# Patient Record
Sex: Male | Born: 1980 | Hispanic: No | Marital: Married | Smoking: Never smoker
Health system: Southern US, Community
[De-identification: ages and names within clinical notes are randomized; demographics above are authoritative.]

## PROBLEM LIST (undated history)

## (undated) DIAGNOSIS — E119 Type 2 diabetes mellitus without complications: Secondary | ICD-10-CM

## (undated) DIAGNOSIS — E785 Hyperlipidemia, unspecified: Secondary | ICD-10-CM

## (undated) DIAGNOSIS — I10 Essential (primary) hypertension: Secondary | ICD-10-CM

## (undated) DIAGNOSIS — G4730 Sleep apnea, unspecified: Secondary | ICD-10-CM

## (undated) DIAGNOSIS — E669 Obesity, unspecified: Secondary | ICD-10-CM

## (undated) HISTORY — DX: Obesity, unspecified: E66.9

## (undated) HISTORY — DX: Sleep apnea, unspecified: G47.30

## (undated) HISTORY — DX: Hyperlipidemia, unspecified: E78.5

## (undated) HISTORY — DX: Type 2 diabetes mellitus without complications: E11.9

## (undated) HISTORY — DX: Essential (primary) hypertension: I10

---

## 2003-10-01 ENCOUNTER — Encounter: Admission: RE | Admit: 2003-10-01 | Discharge: 2003-10-01 | Payer: Self-pay | Admitting: Sports Medicine

## 2003-11-12 ENCOUNTER — Encounter: Admission: RE | Admit: 2003-11-12 | Discharge: 2003-11-12 | Payer: Self-pay | Admitting: Family Medicine

## 2003-11-13 ENCOUNTER — Encounter: Admission: RE | Admit: 2003-11-13 | Discharge: 2004-02-11 | Payer: Self-pay | Admitting: Family Medicine

## 2003-12-10 ENCOUNTER — Encounter: Admission: RE | Admit: 2003-12-10 | Discharge: 2003-12-10 | Payer: Self-pay | Admitting: Family Medicine

## 2003-12-13 ENCOUNTER — Encounter: Admission: RE | Admit: 2003-12-13 | Discharge: 2003-12-13 | Payer: Self-pay | Admitting: Family Medicine

## 2003-12-28 ENCOUNTER — Encounter: Admission: RE | Admit: 2003-12-28 | Discharge: 2003-12-28 | Payer: Self-pay | Admitting: Sports Medicine

## 2004-06-27 ENCOUNTER — Ambulatory Visit (HOSPITAL_BASED_OUTPATIENT_CLINIC_OR_DEPARTMENT_OTHER): Admission: RE | Admit: 2004-06-27 | Discharge: 2004-06-27 | Payer: Self-pay | Admitting: Otolaryngology

## 2004-08-26 ENCOUNTER — Ambulatory Visit (HOSPITAL_BASED_OUTPATIENT_CLINIC_OR_DEPARTMENT_OTHER): Admission: RE | Admit: 2004-08-26 | Discharge: 2004-08-26 | Payer: Self-pay | Admitting: Otolaryngology

## 2004-08-26 ENCOUNTER — Ambulatory Visit: Payer: Self-pay | Admitting: Internal Medicine

## 2004-10-13 ENCOUNTER — Ambulatory Visit: Payer: Self-pay | Admitting: Internal Medicine

## 2004-10-20 ENCOUNTER — Ambulatory Visit: Payer: Self-pay | Admitting: Family Medicine

## 2004-11-17 ENCOUNTER — Ambulatory Visit: Payer: Self-pay | Admitting: Family Medicine

## 2004-11-18 ENCOUNTER — Ambulatory Visit: Payer: Self-pay | Admitting: *Deleted

## 2004-12-08 ENCOUNTER — Ambulatory Visit: Payer: Self-pay | Admitting: Internal Medicine

## 2004-12-08 ENCOUNTER — Ambulatory Visit: Payer: Self-pay | Admitting: Family Medicine

## 2004-12-11 ENCOUNTER — Ambulatory Visit: Payer: Self-pay | Admitting: Internal Medicine

## 2004-12-31 ENCOUNTER — Ambulatory Visit: Payer: Self-pay | Admitting: Internal Medicine

## 2005-01-09 ENCOUNTER — Ambulatory Visit: Payer: Self-pay | Admitting: Family Medicine

## 2005-01-21 ENCOUNTER — Ambulatory Visit: Payer: Self-pay | Admitting: Internal Medicine

## 2006-09-16 DIAGNOSIS — G4730 Sleep apnea, unspecified: Secondary | ICD-10-CM | POA: Insufficient documentation

## 2006-09-16 DIAGNOSIS — J309 Allergic rhinitis, unspecified: Secondary | ICD-10-CM | POA: Insufficient documentation

## 2006-09-16 DIAGNOSIS — I10 Essential (primary) hypertension: Secondary | ICD-10-CM | POA: Insufficient documentation

## 2006-09-16 DIAGNOSIS — E669 Obesity, unspecified: Secondary | ICD-10-CM

## 2014-01-25 ENCOUNTER — Ambulatory Visit (INDEPENDENT_AMBULATORY_CARE_PROVIDER_SITE_OTHER): Payer: No Typology Code available for payment source | Admitting: Internal Medicine

## 2014-01-25 ENCOUNTER — Encounter: Payer: Self-pay | Admitting: Internal Medicine

## 2014-01-25 VITALS — BP 115/61 | HR 86 | Temp 98.3°F | Resp 20 | Ht 67.0 in | Wt 284.0 lb

## 2014-01-25 DIAGNOSIS — E1165 Type 2 diabetes mellitus with hyperglycemia: Principal | ICD-10-CM

## 2014-01-25 DIAGNOSIS — E785 Hyperlipidemia, unspecified: Secondary | ICD-10-CM

## 2014-01-25 DIAGNOSIS — E119 Type 2 diabetes mellitus without complications: Secondary | ICD-10-CM | POA: Insufficient documentation

## 2014-01-25 DIAGNOSIS — IMO0001 Reserved for inherently not codable concepts without codable children: Secondary | ICD-10-CM

## 2014-01-25 DIAGNOSIS — G4730 Sleep apnea, unspecified: Secondary | ICD-10-CM

## 2014-01-25 DIAGNOSIS — E669 Obesity, unspecified: Secondary | ICD-10-CM

## 2014-01-25 DIAGNOSIS — J309 Allergic rhinitis, unspecified: Secondary | ICD-10-CM

## 2014-01-25 DIAGNOSIS — I10 Essential (primary) hypertension: Secondary | ICD-10-CM

## 2014-01-25 MED ORDER — LISINOPRIL-HYDROCHLOROTHIAZIDE 20-25 MG PO TABS: 1.0000 | ORAL_TABLET | Freq: Every day | ORAL | Status: DC

## 2014-01-25 MED ORDER — LOVASTATIN 20 MG PO TABS: 20.0000 mg | ORAL_TABLET | Freq: Every day | ORAL | Status: DC

## 2014-01-25 MED ORDER — METFORMIN HCL 1000 MG PO TABS: 1000.0000 mg | ORAL_TABLET | Freq: Two times a day (BID) | ORAL | Status: DC

## 2014-01-25 MED ORDER — GLIMEPIRIDE 2 MG PO TABS: 2.0000 mg | ORAL_TABLET | Freq: Every day | ORAL | Status: DC

## 2014-01-25 NOTE — Progress Notes (Signed)
Patient ID: Austin Mejia, male   DOB: Nov 07, 1980, 33 y.o.   MRN: [id number]   Austin Mejia, is a 33 y.o. male  [id number]  [id number]  DOB - Nov 07, 1980  CC:  Chief Complaint  Patient presents with  . Establish Care    NP/ Pt is here to recieve treatment for his HTN, Diabeties, as well as High Lipids        HPI: Austin Mejia is a 33 y.o. male here today to establish medical care. He has multiple chronic medical problems including obesity, DM II, Hyperlipidemia and treated HTN. 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  Patient has No headache, No chest pain, No abdominal pain - No Nausea, No new weakness tingling or numbness, No Cough - SOB.  No Known Allergies No past medical history on file. No current outpatient prescriptions on file prior to visit.   No current facility-administered medications on file prior to visit.   No family history on file. History   Social History  . Marital Status: Single    Spouse Name: N/A    Number of Children: N/A  . Years of Education: N/A   Occupational History  . Not on file.   Social History Main Topics  . Smoking status: Never Smoker   . Smokeless tobacco: Not on file  . Alcohol Use: No  . Drug Use: Not on file  . Sexual Activity: Not on file   Other Topics Concern  . Not on file   Social History Narrative  . No narrative on file    Review of Systems: Constitutional: Negative for fever, chills, diaphoresis, activity change, appetite change and fatigue. HENT: Negative for ear pain, nosebleeds, congestion, facial swelling, rhinorrhea, neck pain, neck stiffness and ear discharge.  Eyes: Negative for pain, discharge, redness, itching and visual disturbance. Respiratory: Negative for cough, choking, chest tightness, shortness of breath, wheezing and stridor.  Cardiovascular: Negative for chest pain,  palpitations and leg swelling. Gastrointestinal: Negative for abdominal distention. Genitourinary: Negative for dysuria, urgency, frequency, hematuria, flank pain, decreased urine volume, difficulty urinating and dyspareunia.  Musculoskeletal: Negative for back pain, joint swelling, arthralgia and gait problem. Neurological: Negative for dizziness, tremors, seizures, syncope, facial asymmetry, speech difficulty, weakness, light-headedness, numbness and headaches.  Hematological: Negative for adenopathy. Does not bruise/bleed easily. Psychiatric/Behavioral: Negative for hallucinations, behavioral problems, confusion, dysphoric mood, decreased concentration and agitation.    Objective:    Filed Vitals:   01/25/14 1415  BP: 115/61  Pulse: 86  Temp: 98.3 F (36.8 C)  Resp: 20    Physical Exam: Constitutional: Patient appears morbidly obese. No distress. HENT: Normocephalic, atraumatic, External right and left ear normal. Oropharynx is clear and moist.  Eyes: Conjunctivae and EOM are normal. PERRLA, no scleral icterus. Neck: Normal ROM. Neck supple. No JVD. No tracheal deviation. No thyromegaly. CVS: RRR, S1/S2 +, no murmurs, no gallops, no carotid bruit.  Pulmonary: Effort and breath sounds normal, no stridor, rhonchi, wheezes, rales.  Abdominal: Soft. BS +, no distension, tenderness, rebound or guarding.  Musculoskeletal: Normal range of motion. No edema and no tenderness.  Lymphadenopathy: No lymphadenopathy noted, cervical, inguinal or axillary Neuro: Alert. Normal reflexes, muscle tone coordination. No cranial nerve deficit. Skin: Skin is warm and dry. No rash noted. Not diaphoretic. No erythema. No pallor. Psychiatric: Normal mood and affect. Behavior, judgment, thought content normal.  No results found for this basename: WBC, HGB, HCT,  MCV, PLT   No results found for this basename: CREATININE, BUN, NA, K, CL, CO2    No results found for this basename: HGBA1C   Lipid Panel   No results found for this basename: chol, trig, hdl, cholhdl, vldl, ldlcalc       Assessment and plan:   1. Type II or unspecified type diabetes mellitus without mention of complication, uncontrolled - Pt reports BS to be in the 200's in the evening. He does not check fasting BS. Will continue Metformin and Glimiperide. Advised patient to check fasting blood sugars as well as post prandial BS. Will check the following labs. - Hemoglobin A1C; Future - Comprehensive metabolic panel; Future - Urinalysis; Future  2. APNEA, SLEEP Pt states that he has been using his CPAP but does not think tha this mask is forming a good seal. He has called [facility] for mask replacement. - CBC with Differential; Future  3. HYPERTENSION, BENIGN SYSTEMIC BP well controlled. Continue current medications - Comprehensive metabolic panel; Future - Urinalysis; Future  4. OBESITY, NOS Pt morbidly obese and has not ben able to lose weight for almost 10 years. Will schedule an appointment to discuss obesity plan. Also make referral to nutrition services. - TSH; Future   6. Other and unspecified hyperlipidemia Continue Lovasatatin - Lipid panel; Future   Return for DM II, Hyperlipidemia, Obesity, OSA.  The patient was given clear instructions to go to ER or return to medical center if symptoms don't improve, worsen or new problems develop. The patient verbalized understanding. The patient was told to call to get lab results if they haven't heard anything in the next week.     This note has been created with Education officer, environmental. Any transcriptional errors are unintentional.    Fremont Skalicky A., MD [facility] [place], [place] [phone]   01/25/2014, 3:08 PM

## 2014-01-26 ENCOUNTER — Other Ambulatory Visit: Payer: No Typology Code available for payment source

## 2014-01-26 DIAGNOSIS — I10 Essential (primary) hypertension: Secondary | ICD-10-CM

## 2014-01-26 DIAGNOSIS — E669 Obesity, unspecified: Secondary | ICD-10-CM

## 2014-01-26 DIAGNOSIS — G4730 Sleep apnea, unspecified: Secondary | ICD-10-CM

## 2014-01-26 DIAGNOSIS — E785 Hyperlipidemia, unspecified: Secondary | ICD-10-CM

## 2014-01-26 DIAGNOSIS — IMO0001 Reserved for inherently not codable concepts without codable children: Secondary | ICD-10-CM

## 2014-01-26 DIAGNOSIS — E1165 Type 2 diabetes mellitus with hyperglycemia: Principal | ICD-10-CM

## 2014-01-26 LAB — LIPID PANEL
CHOLESTEROL: 139 mg/dL (ref 0–200)
HDL: 39 mg/dL — ABNORMAL LOW (ref >39)
LDL Cholesterol: 66 mg/dL (ref 0–99)
TRIGLYCERIDES: 172 mg/dL — AB (ref <150)
Total CHOL/HDL Ratio: 3.6 Ratio
VLDL: 34 mg/dL (ref 0–40)

## 2014-01-26 LAB — COMPREHENSIVE METABOLIC PANEL
ALT: 24 U/L (ref 0–53)
AST: 16 U/L (ref 0–37)
Albumin: 4.4 g/dL (ref 3.5–5.2)
Alkaline Phosphatase: 63 U/L (ref 39–117)
BUN: 16 mg/dL (ref 6–23)
CALCIUM: 9.3 mg/dL (ref 8.4–10.5)
CHLORIDE: 102 meq/L (ref 96–112)
CO2: 24 meq/L (ref 19–32)
CREATININE: 0.78 mg/dL (ref 0.50–1.35)
GLUCOSE: 161 mg/dL — AB (ref 70–99)
Potassium: 4.9 mEq/L (ref 3.5–5.3)
Sodium: 135 mEq/L (ref 135–145)
Total Bilirubin: 0.6 mg/dL (ref 0.2–1.2)
Total Protein: 7.2 g/dL (ref 6.0–8.3)

## 2014-01-26 LAB — CBC WITH DIFFERENTIAL/PLATELET
BASOS ABS: 0.1 10*3/uL (ref 0.0–0.1)
Basophils Relative: 1 % (ref 0–1)
Eosinophils Absolute: 0.2 10*3/uL (ref 0.0–0.7)
Eosinophils Relative: 2 % (ref 0–5)
HCT: 41.6 % (ref 39.0–52.0)
HEMOGLOBIN: 14.2 g/dL (ref 13.0–17.0)
LYMPHS PCT: 34 % (ref 12–46)
Lymphs Abs: 2.8 10*3/uL (ref 0.7–4.0)
MCH: 28.6 pg (ref 26.0–34.0)
MCHC: 34.1 g/dL (ref 30.0–36.0)
MCV: 83.9 fL (ref 78.0–100.0)
MONO ABS: 0.4 10*3/uL (ref 0.1–1.0)
MONOS PCT: 5 % (ref 3–12)
Neutro Abs: 4.8 10*3/uL (ref 1.7–7.7)
Neutrophils Relative %: 58 % (ref 43–77)
Platelets: 309 10*3/uL (ref 150–400)
RBC: 4.96 MIL/uL (ref 4.22–5.81)
RDW: 14.1 % (ref 11.5–15.5)
WBC: 8.2 10*3/uL (ref 4.0–10.5)

## 2014-01-27 LAB — URINALYSIS
BILIRUBIN URINE: NEGATIVE
Glucose, UA: NEGATIVE mg/dL
Hgb urine dipstick: NEGATIVE
Ketones, ur: NEGATIVE mg/dL
LEUKOCYTES UA: NEGATIVE
Nitrite: NEGATIVE
Protein, ur: NEGATIVE mg/dL
SPECIFIC GRAVITY, URINE: 1.025 (ref 1.005–1.030)
UROBILINOGEN UA: 0.2 mg/dL (ref 0.0–1.0)
pH: 5.5 (ref 5.0–8.0)

## 2014-01-27 LAB — TSH: TSH: 1.406 u[IU]/mL (ref 0.350–4.500)

## 2014-01-27 LAB — HEMOGLOBIN A1C
Hgb A1c MFr Bld: 8.8 % — ABNORMAL HIGH (ref <5.7)
Mean Plasma Glucose: 206 mg/dL — ABNORMAL HIGH (ref <117)

## 2014-02-21 ENCOUNTER — Ambulatory Visit: Payer: No Typology Code available for payment source

## 2014-02-27 ENCOUNTER — Encounter: Payer: Self-pay | Admitting: Family Medicine

## 2014-02-27 ENCOUNTER — Ambulatory Visit (INDEPENDENT_AMBULATORY_CARE_PROVIDER_SITE_OTHER): Payer: No Typology Code available for payment source | Admitting: Family Medicine

## 2014-02-27 VITALS — BP 134/67 | HR 76 | Temp 98.1°F | Resp 18 | Wt 290.0 lb

## 2014-02-27 DIAGNOSIS — E669 Obesity, unspecified: Secondary | ICD-10-CM

## 2014-02-27 DIAGNOSIS — G4730 Sleep apnea, unspecified: Secondary | ICD-10-CM

## 2014-02-27 DIAGNOSIS — I10 Essential (primary) hypertension: Secondary | ICD-10-CM

## 2014-02-27 DIAGNOSIS — Z23 Encounter for immunization: Secondary | ICD-10-CM

## 2014-02-27 DIAGNOSIS — E1165 Type 2 diabetes mellitus with hyperglycemia: Secondary | ICD-10-CM

## 2014-02-27 DIAGNOSIS — IMO0001 Reserved for inherently not codable concepts without codable children: Secondary | ICD-10-CM

## 2014-02-27 NOTE — Progress Notes (Signed)
Subjective:    Patient ID: Austin Mejia, male    DOB: [birth year], 33 y.o.   MRN: [id number]  HPI  Patient presents for 1 month follow up of chronic diseases. He states that he feels well and is taking medications consistently. He reports that he has not changed his diet or started an exercise regimen.   Patient complaining of diabetes mellitus, type II. He states that he has been taking medications consistently. He reports that he has not modified diet. He denies headache, fatigue,weakness, polyuria, polydipsia, and polyphagia.   Patient here for follow-up of hypertension. He is not exercising and is not adherent to low salt diet.  Blood pressure is well controlled at home. Cardiac symptoms  Patient denies chest pain, chest pressure/discomfort, dyspnea, fatigue, irregular heart beat, orthopnea, palpitations and syncope.  Cardiovascular risk factors include  diabetes mellitus, dyslipidemia, family history of premature cardiovascular disease, obesity (BMI >= 30 kg/m2) and sedentary lifestyle.      Past Medical History  Diagnosis Date  . Diabetes mellitus without complication   . Hyperlipidemia   . Hypertension   . Sleep apnea   . Obesity y-10     Review of Systems  Constitutional: Positive for unexpected weight change. Negative for fatigue.  HENT: Negative.   Eyes: Negative.   Respiratory: Positive for shortness of breath. Negative for wheezing.   Cardiovascular: Negative.   Gastrointestinal: Negative.   Endocrine: Negative.  Negative for cold intolerance, heat intolerance, polydipsia, polyphagia and polyuria.  Genitourinary: Negative.   Musculoskeletal: Negative.  Negative for back pain and myalgias.  Skin: Negative.   Allergic/Immunologic: Negative.   Neurological: Negative.  Negative for dizziness and light-headedness.  Hematological: Negative.   Psychiatric/Behavioral: Negative.       Objective:   Physical Exam  Constitutional: He is oriented to person,  place, and time. He appears well-developed and well-nourished. He is active. He does not appear ill.  HENT:  Head: Normocephalic.  Right Ear: External ear normal.  Eyes: Conjunctivae and EOM are normal. Pupils are equal, round, and reactive to light.  Neck: Normal range of motion. Neck supple.  Cardiovascular: Normal rate, regular rhythm and normal heart sounds.   Musculoskeletal: Normal range of motion.  Neurological: He is alert and oriented to person, place, and time.  Skin: Skin is warm and dry.     Psychiatric: He has a normal mood and affect. His behavior is normal. Judgment and thought content normal.      BP 134/67  Pulse 76  Temp(Src) 98.1 F (36.7 C) (Oral)  Resp 18  Wt 290 lb (131.543 kg)  SpO2 99%   .   Assessment & Plan:   1. Diabetes Type II: Patient reports that his blood sugars have improved on current medication regimen. He is not checking blood sugars in the am. Recommend that patient checks fasting blood sugars. Discussed symptoms of hypoglycemia, patient expressed understanding. Reviewed current laboratory results, will re-check hemoglobin A1C in 2 months. Will continue Metformin and Glimiperide. Advised patient to check fasting blood sugars as well as post prandial BS. Will check the following labs. Diabetic foot examination normal.   2. OSA- Patient states that he is using CPAP consistently. Sleep has improved since replacing mask.   3. Hypertension-BP stable on current medication regimen, will continue current medication regimen  4.  Obesity- Patient has gained weight since last visit. Given information on 2000 calorie diet divided over 6 small meals. Discussed meal plan information at length. Drink  6-8 glasses of  water per day. Exercise 30 minutes 5 days per week.  Obesity referral sent per Dr. Ashley RoyaltyMatthews previous note.  5. Hyperlipidemia-Continue current medication regimen. Will check fasting lipid panel (future order). Start Aspirin 81 mg daily.   RTC:  3 months Labs: Hgba1C, lipid panel (future orders)    Immunizations: Tdap received without complication.        Massie MaroonHollis,Ayoub Arey M, FNP

## 2014-02-27 NOTE — Patient Instructions (Signed)
2000 calorie diet divided over 6 meals. Low fat diet  8-10 glasses of water Exercise 30 minutes for 5 days per week Baby aspirin 81 mg

## 2014-05-30 ENCOUNTER — Ambulatory Visit: Payer: No Typology Code available for payment source | Admitting: Family Medicine

## 2014-06-06 ENCOUNTER — Ambulatory Visit: Payer: No Typology Code available for payment source | Admitting: Family Medicine

## 2014-06-22 ENCOUNTER — Ambulatory Visit: Payer: No Typology Code available for payment source | Attending: Family Medicine

## 2014-07-05 ENCOUNTER — Ambulatory Visit: Payer: No Typology Code available for payment source | Admitting: Family Medicine

## 2017-03-02 ENCOUNTER — Encounter (INDEPENDENT_AMBULATORY_CARE_PROVIDER_SITE_OTHER): Payer: Self-pay | Admitting: Physician Assistant

## 2017-03-02 ENCOUNTER — Ambulatory Visit (INDEPENDENT_AMBULATORY_CARE_PROVIDER_SITE_OTHER): Payer: Self-pay | Admitting: Physician Assistant

## 2017-03-02 VITALS — BP 135/88 | HR 76 | Temp 98.1°F | Wt 257.4 lb

## 2017-03-02 DIAGNOSIS — R739 Hyperglycemia, unspecified: Secondary | ICD-10-CM

## 2017-03-02 DIAGNOSIS — Z23 Encounter for immunization: Secondary | ICD-10-CM

## 2017-03-02 DIAGNOSIS — B3742 Candidal balanitis: Secondary | ICD-10-CM

## 2017-03-02 DIAGNOSIS — E118 Type 2 diabetes mellitus with unspecified complications: Secondary | ICD-10-CM

## 2017-03-02 LAB — POCT CBG (FASTING - GLUCOSE)-MANUAL ENTRY: Glucose Fasting, POC: 266 mg/dL — AB (ref 70–99)

## 2017-03-02 LAB — POCT GLYCOSYLATED HEMOGLOBIN (HGB A1C): Hemoglobin A1C: 11

## 2017-03-02 MED ORDER — METFORMIN HCL 1000 MG PO TABS: 1000.0000 mg | ORAL_TABLET | Freq: Two times a day (BID) | ORAL | 3 refills | Status: DC

## 2017-03-02 MED ORDER — GLIPIZIDE 10 MG PO TABS: 10.0000 mg | ORAL_TABLET | Freq: Two times a day (BID) | ORAL | 3 refills | Status: DC

## 2017-03-02 MED ORDER — BLOOD GLUCOSE MONITOR KIT: PACK | 0 refills | Status: AC

## 2017-03-02 MED ORDER — FLUCONAZOLE 150 MG PO TABS: 150.0000 mg | ORAL_TABLET | ORAL | 0 refills | Status: DC

## 2017-03-02 MED ORDER — LISINOPRIL 40 MG PO TABS: 40.0000 mg | ORAL_TABLET | Freq: Every day | ORAL | 3 refills | Status: DC

## 2017-03-02 MED FILL — TRUE METRIX TEST STRIP: 30 days supply | Qty: 100 | Fill #0

## 2017-03-02 MED FILL — TRUEplus LANCETS 28G MISC: 30 days supply | Qty: 100 | Fill #0

## 2017-03-02 MED FILL — !TRUE METRIX BLOOD GLUCOSE: 1 days supply | Qty: 1 | Fill #0

## 2017-03-02 NOTE — Patient Instructions (Signed)
La diabetes mellitus y los alimentos (Diabetes Mellitus and Food) Es importante que controle su nivel de azcar en la sangre (glucosa). El nivel de glucosa en sangre depende en gran medida de lo que usted come. Comer alimentos saludables en las cantidades adecuadas a lo largo del da, aproximadamente a la misma hora todos los das, lo ayudar a controlar su nivel de glucosa en sangre. Tambin puede ayudarlo a retrasar o evitar el empeoramiento de la diabetes mellitus. Comer de manera saludable incluso puede ayudarlo a mejorar el nivel de presin arterial y a alcanzar o mantener un peso saludable. Entre las recomendaciones generales para alimentarse y cocinar los alimentos de forma saludable, se incluyen las siguientes:  Respetar las comidas principales y comer colaciones con regularidad. Evitar pasar largos perodos sin comer con el fin de perder peso.  Seguir una dieta que consista principalmente en alimentos de origen vegetal, como frutas, vegetales, frutos secos, legumbres y cereales integrales.  Utilizar mtodos de coccin a baja temperatura, como hornear, en lugar de mtodos de coccin a alta temperatura, como frer en abundante aceite. Trabaje con el nutricionista para aprender a usar la informacin nutricional de las etiquetas de los alimentos. CMO PUEDEN AFECTARME LOS ALIMENTOS? Carbohidratos Los carbohidratos afectan el nivel de glucosa en sangre ms que cualquier otro tipo de alimento. El nutricionista lo ayudar a determinar cuntos carbohidratos puede consumir en cada comida y ensearle a contarlos. El recuento de carbohidratos es importante para mantener la glucosa en sangre en un nivel saludable, en especial si utiliza insulina o toma determinados medicamentos para la diabetes mellitus. Alcohol El alcohol puede provocar disminuciones sbitas de la glucosa en sangre (hipoglucemia), en especial si utiliza insulina o toma determinados medicamentos para la diabetes mellitus. La  hipoglucemia es una afeccin que puede poner en peligro la vida. Los sntomas de la hipoglucemia (somnolencia, mareos y desorientacin) son similares a los sntomas de haber consumido mucho alcohol. Si el mdico lo autoriza a beber alcohol, hgalo con moderacin y siga estas pautas:  Las mujeres no deben beber ms de un trago por da, y los hombres no deben beber ms de dos tragos por da. Un trago es igual a:  12 onzas (355 ml) de cerveza  5 onzas de vino (150 ml) de vino  1,5onzas (45ml) de bebidas espirituosas  No beba con el estmago vaco.  Mantngase hidratado. Beba agua, gaseosas dietticas o t helado sin azcar.  Las gaseosas comunes, los jugos y otros refrescos podran contener muchos carbohidratos y se deben contar. QU ALIMENTOS NO SE RECOMIENDAN? Cuando haga las elecciones de alimentos, es importante que recuerde que todos los alimentos son distintos. Algunos tienen menos nutrientes que otros por porcin, aunque podran tener la misma cantidad de caloras o carbohidratos. Es difcil darle al cuerpo lo que necesita cuando consume alimentos con menos nutrientes. Estos son algunos ejemplos de alimentos que debera evitar ya que contienen muchas caloras y carbohidratos, pero pocos nutrientes:  Grasas trans (la mayora de los alimentos procesados incluyen grasas trans en la etiqueta de Informacin nutricional).  Gaseosas comunes.  Jugos.  Caramelos.  Dulces, como tortas, pasteles, rosquillas y galletas.  Comidas fritas. QU ALIMENTOS PUEDO COMER? Consuma alimentos ricos en nutrientes, que nutrirn el cuerpo y lo mantendrn saludable. Los alimentos que debe comer tambin dependern de varios factores, como:  Las caloras que necesita.  Los medicamentos que toma.  Su peso.  El nivel de glucosa en sangre.  El nivel de presin arterial.  El nivel de colesterol. Debe consumir   una amplia variedad de alimentos, por ejemplo:  Protenas.  Cortes de carne  magros.  Protenas con bajo contenido de grasas saturadas, como pescado, clara de huevo y frijoles. Evite las carnes procesadas.  Frutas y vegetales.  Frutas y vegetales que pueden ayudar a controlar los niveles sanguneos de glucosa, como manzanas, mangos y batatas.  Productos lcteos.  Elija productos lcteos sin grasa o con bajo contenido de grasa, como leche, yogur y queso.  Cereales, panes, pastas y arroz.  Elija cereales integrales, como panes multicereales, avena en grano y arroz integral. Estos alimentos pueden ayudar a controlar la presin arterial.  Grasas.  Alimentos que contengan grasas saludables, como frutos secos, aguacate, aceite de oliva, aceite de canola y pescado. TODOS LOS QUE PADECEN DIABETES MELLITUS TIENEN EL MISMO PLAN DE COMIDAS? Dado que todas las personas que padecen diabetes mellitus son distintas, no hay un solo plan de comidas que funcione para todos. Es muy importante que se rena con un nutricionista que lo ayudar a crear un plan de comidas adecuado para usted. Esta informacin no tiene como fin reemplazar el consejo del mdico. Asegrese de hacerle al mdico cualquier pregunta que tenga. Document Released: 10/13/2007 Document Revised: 07/27/2014 Document Reviewed: 06/02/2013 Elsevier Interactive Patient Education  2017 Elsevier Inc.  

## 2017-03-02 NOTE — Progress Notes (Signed)
Subjective:  Patient ID: Austin Mejia, male    DOB: [birth year]  Age: 36 y.o. MRN: [id number]  CC: Dm  HPI Austin Mejia is a 36 y.o. male with a medical hx of DM2, HTN, HLD, OSA, and obesity presents as a new patient for management of DM2. He has been a diabetic for many years. Currently takes Metformin 1022m BID and Glipizide 5 mg qday as directed for the past month. Was noncompliant before. Does not have a glucometer. Has never used insulin. Symptoms include fatigue and occasional dysuria with penile irritation. Denies abdominal pain, confusion, HA, polydipsia, polyuria, polyphagia, tingling, numbness, or visual blurring. Does not endorse any other symptoms or complaints.     ROS Review of Systems  Constitutional: Negative for chills, fever and malaise/fatigue.  Eyes: Negative for blurred vision.  Respiratory: Negative for shortness of breath.   Cardiovascular: Negative for chest pain and palpitations.  Gastrointestinal: Negative for abdominal pain and nausea.  Genitourinary: Positive for dysuria. Negative for hematuria.       Penile irritation  Musculoskeletal: Negative for joint pain and myalgias.  Skin: Negative for rash.  Neurological: Negative for tingling and headaches.  Psychiatric/Behavioral: Negative for depression. The patient is not nervous/anxious.     Objective:  BP 135/88 (BP Location: Left Arm, Patient Position: Sitting, Cuff Size: Normal)   Pulse 76   Temp 98.1 F (36.7 C) (Oral)   Wt 257 lb 6.4 oz (116.8 kg)   SpO2 96%   BMI 40.31 kg/m   BP/Weight 03/02/2017 85/37/482770/01/8674 Systolic BP [id number] Diastolic BP 88 67 61  Wt. (Lbs) 257.4 290 284  BMI 40.31 45.41 44.47      Physical Exam  Constitutional: He is oriented to person, place, and time.  Well developed, obese, NAD, polite  HENT:  Head: Normocephalic and atraumatic.  Missing several teeth  Eyes: No scleral icterus.  Neck: Normal range of motion. Neck supple. No  thyromegaly present.  Cardiovascular: Normal rate, regular rhythm and normal heart sounds.   Pulmonary/Chest: Effort normal and breath sounds normal.  Abdominal: Soft. Bowel sounds are normal. There is no tenderness.  Genitourinary:  Genitourinary Comments: Penis uncircumcised with mild erythema and mild fissuring in the foreskin.  Musculoskeletal: He exhibits no edema.  Lymphadenopathy:    He has no cervical adenopathy.  Neurological: He is alert and oriented to person, place, and time. No cranial nerve deficit. Coordination normal.  Skin: Skin is warm and dry. No rash noted. No erythema. No pallor.  Psychiatric: He has a normal mood and affect. His behavior is normal. Thought content normal.  Vitals reviewed.    Assessment & Plan:    1. Type 2 diabetes mellitus with complication, without long-term current use of insulin (HCC) - Refill metFORMIN (GLUCOPHAGE) 1000 MG tablet; Take 1 tablet (1,000 mg total) by mouth 2 (two) times daily with a meal.  Dispense: 180 tablet; Refill: 3 - Increase glipiZIDE (GLUCOTROL) 10 MG tablet; Take 1 tablet (10 mg total) by mouth 2 (two) times daily before a meal.  Dispense: 60 tablet; Refill: 3 - Increase lisinopril (PRINIVIL,ZESTRIL) 40 MG tablet; Take 1 tablet (40 mg total) by mouth daily.  Dispense: 90 tablet; Refill: 3 - Begin blood glucose meter kit and supplies KIT; Dispense based on patient and insurance preference. Use up to four times daily as directed. (FOR ICD-9 250.00, 250.01).  Dispense: 1 each; Refill: 0 - Comprehensive metabolic panel - Lipid panel  2. Hyperglycemia - Glucose (CBG), Fasting 266  in clinic today - HgB A1c11.0% in clinic today  3. Need for Tdap vaccination - Tdap vaccine greater than or equal to 7yo IM  4. Need for prophylactic vaccination against Streptococcus pneumoniae (pneumococcus) - Pneumococcal conjugate vaccine 13-valent  5. Candidal balanitis - fluconazole (DIFLUCAN) 150 MG tablet; Take 1 tablet (150 mg  total) by mouth every 7 (seven) days.  Dispense: 2 tablet; Refill: 0   Meds ordered this encounter  Medications  . metFORMIN (GLUCOPHAGE) 1000 MG tablet    Sig: Take 1 tablet (1,000 mg total) by mouth 2 (two) times daily with a meal.    Dispense:  180 tablet    Refill:  3    Order Specific Question:   Supervising Provider    Answer:   Tresa Garter [id number]  . glipiZIDE (GLUCOTROL) 10 MG tablet    Sig: Take 1 tablet (10 mg total) by mouth 2 (two) times daily before a meal.    Dispense:  60 tablet    Refill:  3    Order Specific Question:   Supervising Provider    Answer:   Tresa Garter [id number]  . lisinopril (PRINIVIL,ZESTRIL) 40 MG tablet    Sig: Take 1 tablet (40 mg total) by mouth daily.    Dispense:  90 tablet    Refill:  3    Order Specific Question:   Supervising Provider    Answer:   Tresa Garter [id number]  . fluconazole (DIFLUCAN) 150 MG tablet    Sig: Take 1 tablet (150 mg total) by mouth every 7 (seven) days.    Dispense:  2 tablet    Refill:  0    Order Specific Question:   Supervising Provider    Answer:   Tresa Garter [id number]  . blood glucose meter kit and supplies KIT    Sig: Dispense based on patient and insurance preference. Use up to four times daily as directed. (FOR ICD-9 250.00, 250.01).    Dispense:  1 each    Refill:  0    Order Specific Question:   Number of strips    Answer:   100    Order Specific Question:   Number of lancets    Answer:   100    Order Specific Question:   Supervising Provider    Answer:   Tresa Garter [[id number]]    Follow-up: Return in about 4 weeks (around 03/30/2017) for DM 2 bring glucose log.   Clent Demark PA

## 2017-03-03 LAB — COMPREHENSIVE METABOLIC PANEL
A/G RATIO: 1.5 (ref 1.2–2.2)
ALT: 21 IU/L (ref 0–44)
AST: 17 IU/L (ref 0–40)
Albumin: 4.5 g/dL (ref 3.5–5.5)
Alkaline Phosphatase: 83 IU/L (ref 39–117)
BUN / CREAT RATIO: 21 — AB (ref 9–20)
BUN: 16 mg/dL (ref 6–20)
Bilirubin Total: 0.3 mg/dL (ref 0.0–1.2)
CALCIUM: 9.7 mg/dL (ref 8.7–10.2)
CHLORIDE: 99 mmol/L (ref 96–106)
CO2: 22 mmol/L (ref 20–29)
Creatinine, Ser: 0.77 mg/dL (ref 0.76–1.27)
GFR calc Af Amer: 136 mL/min/{1.73_m2} (ref >59)
GFR calc non Af Amer: 118 mL/min/{1.73_m2} (ref >59)
GLUCOSE: 273 mg/dL — AB (ref 65–99)
Globulin, Total: 3.1 g/dL (ref 1.5–4.5)
Potassium: 4.6 mmol/L (ref 3.5–5.2)
Sodium: 138 mmol/L (ref 134–144)
Total Protein: 7.6 g/dL (ref 6.0–8.5)

## 2017-03-03 LAB — LIPID PANEL
CHOLESTEROL TOTAL: 196 mg/dL (ref 100–199)
Chol/HDL Ratio: 4.5 ratio (ref 0.0–5.0)
HDL: 44 mg/dL (ref >39)
LDL Calculated: 95 mg/dL (ref 0–99)
Triglycerides: 283 mg/dL — ABNORMAL HIGH (ref 0–149)
VLDL Cholesterol Cal: 57 mg/dL — ABNORMAL HIGH (ref 5–40)

## 2017-03-10 ENCOUNTER — Telehealth (INDEPENDENT_AMBULATORY_CARE_PROVIDER_SITE_OTHER): Payer: Self-pay

## 2017-03-10 NOTE — Telephone Encounter (Signed)
-----   Message from Loletta Specter, PA-C sent at 03/03/2017  8:18 AM EDT ----- Liver and kidney results normal. Triglycerides are elevated. I recommend using OTC Max strength Fish Oil pills. She may pick whichever brand she wants. Eat less fried and fatty foods. Eat much less sweets.

## 2017-03-10 NOTE — Telephone Encounter (Signed)
Called patient, no answer. Left message asking patient to call the office back,. If patient calls back please provide with lab results, Liver and kidney results normal. Triglycerides are elevated. I recommend using OTC Max strength Fish Oil pills. She may pick whichever brand she wants. Eat less fried and fatty foods. Eat much less sweets. Maryjean Morn, CMA

## 2017-04-01 ENCOUNTER — Ambulatory Visit (INDEPENDENT_AMBULATORY_CARE_PROVIDER_SITE_OTHER): Payer: No Typology Code available for payment source | Admitting: Physician Assistant

## 2019-04-14 ENCOUNTER — Other Ambulatory Visit: Payer: Self-pay | Admitting: Nurse Practitioner

## 2019-04-14 ENCOUNTER — Other Ambulatory Visit: Payer: Self-pay

## 2019-04-14 ENCOUNTER — Other Ambulatory Visit: Payer: No Typology Code available for payment source

## 2019-04-14 ENCOUNTER — Ambulatory Visit
Admission: RE | Admit: 2019-04-14 | Discharge: 2019-04-14 | Disposition: A | Discharge status: Home / Self Care | Payer: Self-pay | Source: Ambulatory Visit | Attending: Nurse Practitioner | Admitting: Nurse Practitioner

## 2019-04-14 DIAGNOSIS — T1490XA Injury, unspecified, initial encounter: Secondary | ICD-10-CM

## 2019-08-14 ENCOUNTER — Ambulatory Visit: Payer: No Typology Code available for payment source | Attending: Internal Medicine

## 2019-08-14 DIAGNOSIS — Z20822 Contact with and (suspected) exposure to covid-19: Secondary | ICD-10-CM

## 2019-08-14 DIAGNOSIS — U071 COVID-19: Secondary | ICD-10-CM | POA: Insufficient documentation

## 2019-08-15 LAB — NOVEL CORONAVIRUS, NAA: SARS-CoV-2, NAA: DETECTED — AB

## 2021-04-13 IMAGING — CR DG HAND COMPLETE 3+V*R*
3 series · 3 of 3 positions shown · non-contrast
Comparison: None.

CLINICAL DATA: Right palm redness and swelling. Recent foreign
body.

EXAM:
RIGHT HAND - COMPLETE 3+ VIEW

[x hand pa right]
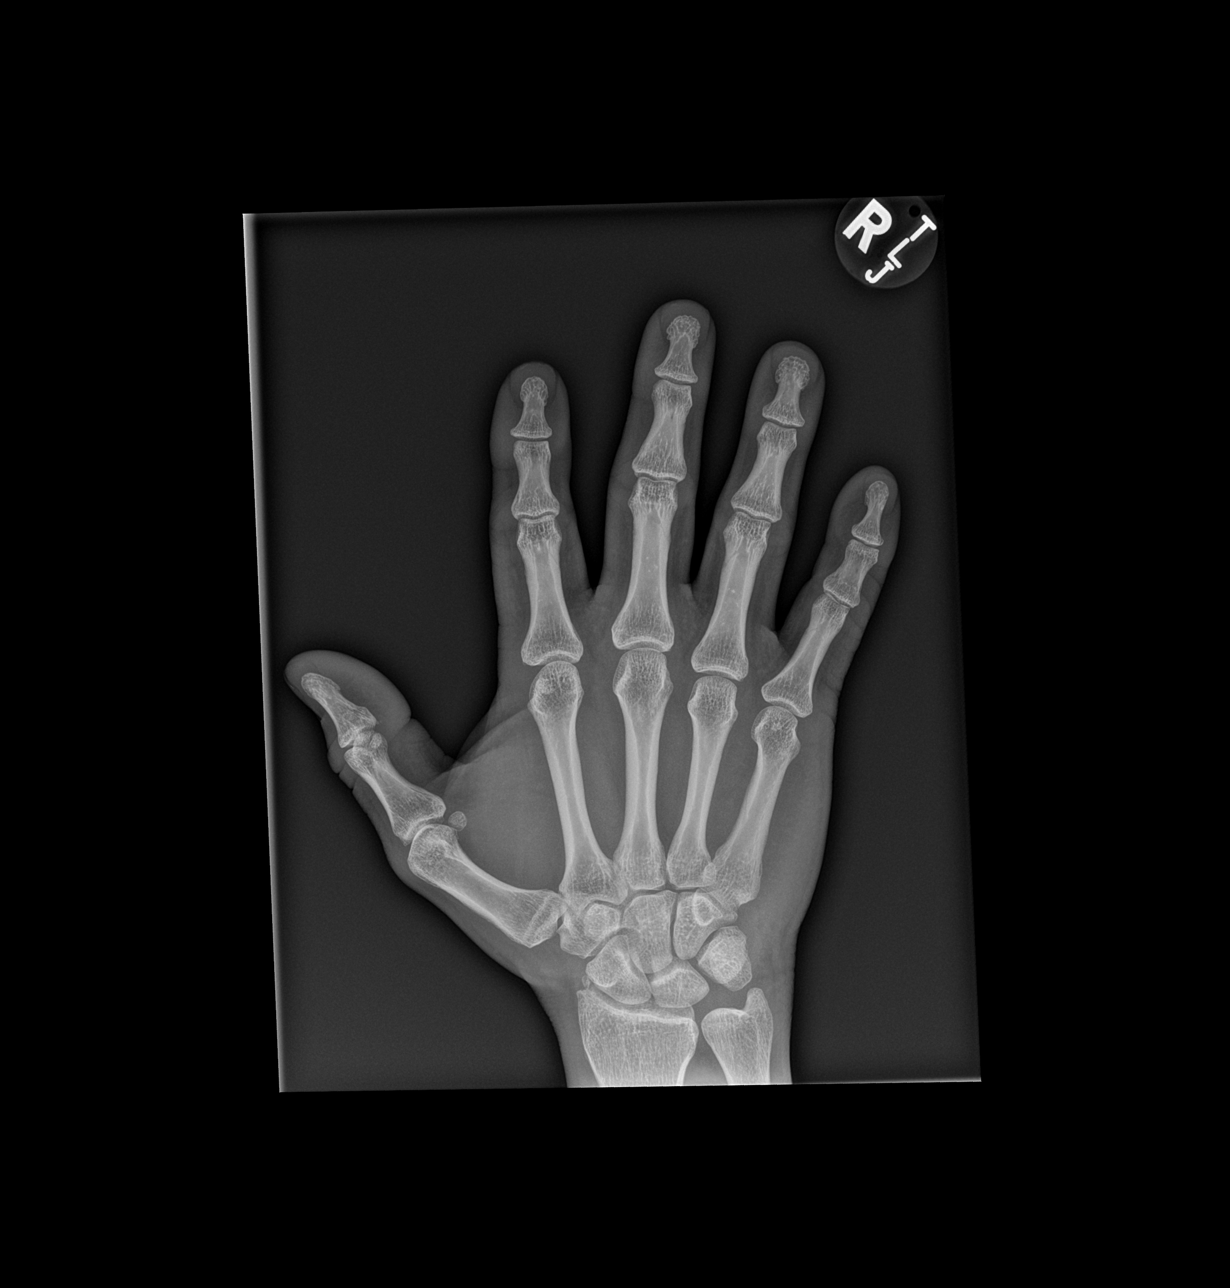

[x hand obl right]
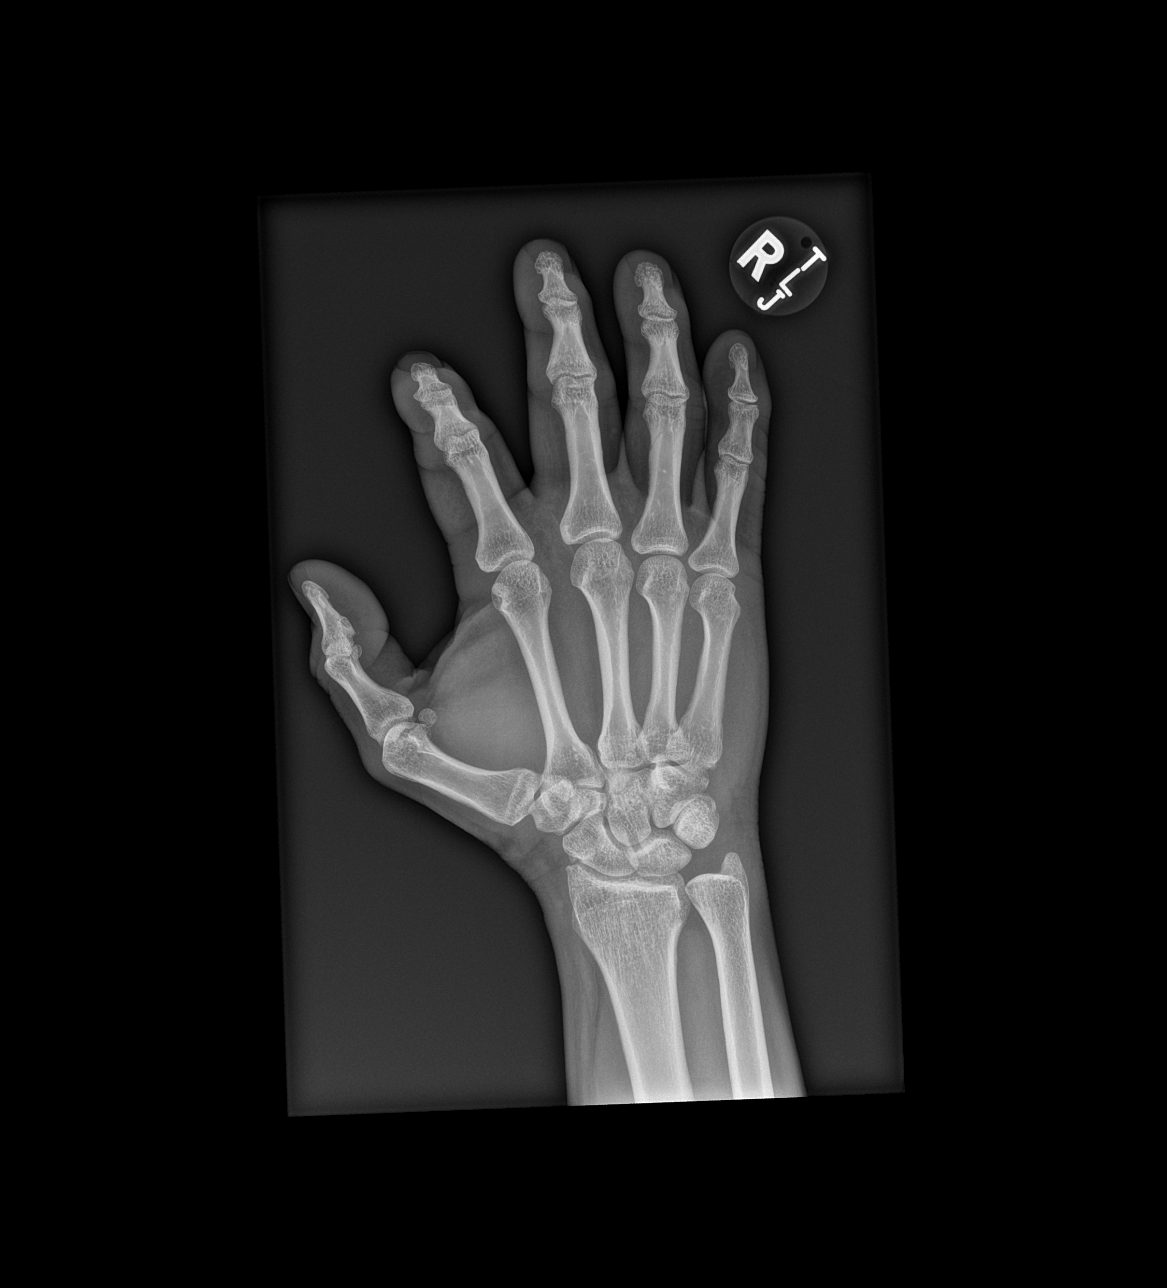

[x hand lat right]
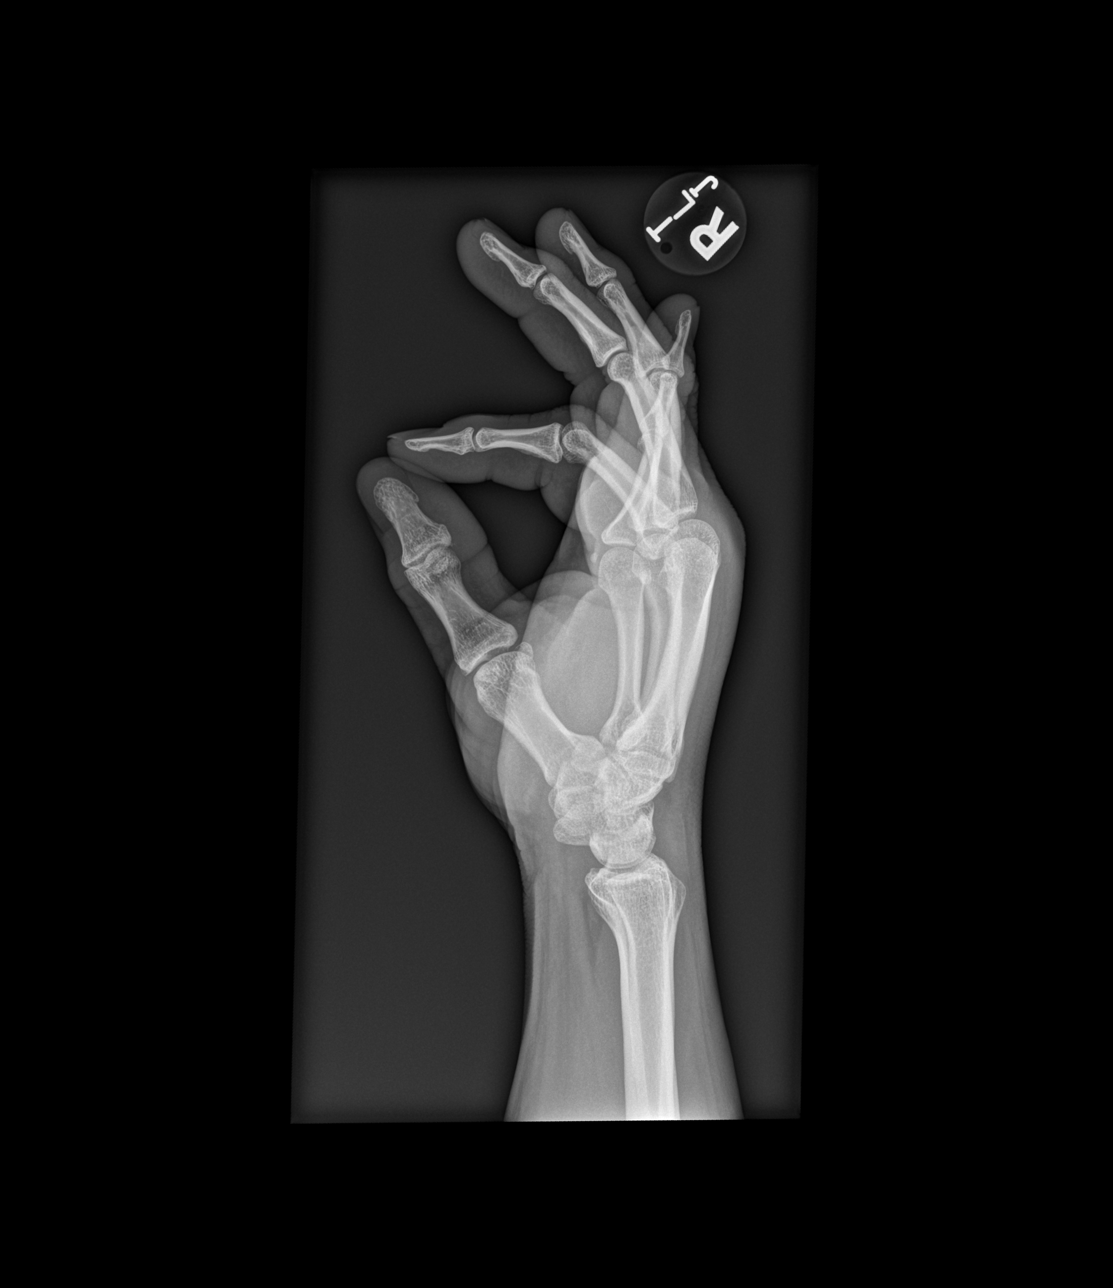

[3 of 3 positions shown; findings below may reference images not displayed]

FINDINGS: No acute fracture or dislocation. Joint spaces are preserved. No
osseous erosion or cortical destruction. No focal soft tissue
swelling. No soft tissue gas. No radiopaque foreign body.
IMPRESSION: 1. No acute findings.
2. No radiopaque foreign body. Ultrasound evaluation of the hand
could be performed to evaluate for the presence of a non-radiopaque
foreign body as clinically indicated.

## 2021-05-26 ENCOUNTER — Other Ambulatory Visit: Payer: Self-pay

## 2021-05-26 ENCOUNTER — Ambulatory Visit: Payer: Self-pay | Admitting: Internal Medicine

## 2021-05-26 ENCOUNTER — Encounter: Payer: Self-pay | Admitting: Internal Medicine

## 2021-05-26 VITALS — BP 126/82 | HR 84 | Resp 20 | Ht 65.5 in | Wt 226.5 lb

## 2021-05-26 DIAGNOSIS — E1169 Type 2 diabetes mellitus with other specified complication: Secondary | ICD-10-CM

## 2021-05-26 DIAGNOSIS — K439 Ventral hernia without obstruction or gangrene: Secondary | ICD-10-CM

## 2021-05-26 DIAGNOSIS — Z1159 Encounter for screening for other viral diseases: Secondary | ICD-10-CM

## 2021-05-26 DIAGNOSIS — Z114 Encounter for screening for human immunodeficiency virus [HIV]: Secondary | ICD-10-CM

## 2021-05-26 DIAGNOSIS — M545 Low back pain, unspecified: Secondary | ICD-10-CM

## 2021-05-26 DIAGNOSIS — E119 Type 2 diabetes mellitus without complications: Secondary | ICD-10-CM

## 2021-05-26 DIAGNOSIS — Z23 Encounter for immunization: Secondary | ICD-10-CM

## 2021-05-26 DIAGNOSIS — I10 Essential (primary) hypertension: Secondary | ICD-10-CM

## 2021-05-26 DIAGNOSIS — E118 Type 2 diabetes mellitus with unspecified complications: Secondary | ICD-10-CM

## 2021-05-26 DIAGNOSIS — Z79899 Other long term (current) drug therapy: Secondary | ICD-10-CM

## 2021-05-26 MED ORDER — GLIPIZIDE 10 MG PO TABS: 10.0000 mg | ORAL_TABLET | Freq: Two times a day (BID) | ORAL | 3 refills | Status: DC

## 2021-05-26 MED ORDER — LISINOPRIL 40 MG PO TABS: 40.0000 mg | ORAL_TABLET | Freq: Every day | ORAL | 3 refills | Status: DC

## 2021-05-26 MED ORDER — ATORVASTATIN CALCIUM 20 MG PO TABS: 20.0000 mg | ORAL_TABLET | Freq: Every day | ORAL | 11 refills | Status: DC

## 2021-05-26 MED ORDER — METFORMIN HCL 1000 MG PO TABS: 1000.0000 mg | ORAL_TABLET | Freq: Two times a day (BID) | ORAL | 3 refills | Status: DC

## 2021-05-26 NOTE — Patient Instructions (Signed)

## 2021-05-26 NOTE — Progress Notes (Unsigned)
    Subjective:    Patient ID: Austin Mejia, male   DOB: [birth year], 40 y.o.   MRN: [id number]   HPI  Here to establish  Karen Kays interprets   Abdominal discomfort:  Has a burning sensation occasionally in midabdomen--can see a bulge above the abdominal wall line when pushes his belly out.  No real pain.  Has had for 4 years.  May have enlarged a bit over time.    2.  [place] off a small ladder about 3 months ago.  Fell backward onto his back and has had left low back pain since.  No radiation down leg.  No numbness or weakness.    3.  DM Type 2:  has not taken medication for a month, but prior to that was stretching out he states for just 1 month.  Last A1C in this chart was 03/02/2017 at 11.0%.  Lowest was in 2015 at 8.8% No eye exam this year.  Has never had an eye check.   Does not have glucometer and testing equipment currently No orange card.   4.  Hyperlipidemia:  was on prescription medication at one point, but for reasons of which he is not certain, he no longer takes the medicine.  He is instead taking Vitamin B complex, Omega 3 fatty acids, and Vitamin B12  5.  HM:  has not had an influenza vaccine yet.  Has not had bivalent COvID booster. Has had 3 vaccines of Pfizer otherwise.    6.  Hypertension:  Usually takes Lisinopril.    7.  Obesity:    Drinks 1 soda daily.  21 tortillas daily not unusual.  Describes and high carb diet.    8.  OSA:  on CPAP.  He does not know his pressure settings.  Perhaps 5 cm of H20  Current Meds  Medication Sig   b complex vitamins capsule Take 1 capsule by mouth daily.   blood glucose meter kit and supplies KIT Dispense based on patient and insurance preference. Use up to four times daily as directed. (FOR ICD-9 250.00, 250.01).   cyanocobalamin 1000 MCG tablet Take 1,000 mcg by mouth daily.   Omega-3 Fatty Acids (FISH OIL) 1000 MG CAPS Take by mouth.   No Known Allergies   Review of Systems    Objective:   BP 126/82  (BP Location: Left Arm, Patient Position: Sitting, Cuff Size: Normal)   Pulse 84   Resp 20   Ht 5' 5.5" (1.664 m)   Wt 226 lb 8 oz (102.7 kg)   BMI 37.12 kg/m   Physical Exam   Assessment & Plan

## 2021-05-27 LAB — HGB A1C W/O EAG: Hgb A1c MFr Bld: 11.9 % — ABNORMAL HIGH (ref 4.8–5.6)

## 2021-05-27 LAB — COMPREHENSIVE METABOLIC PANEL
ALT: 18 IU/L (ref 0–44)
AST: 11 IU/L (ref 0–40)
Albumin/Globulin Ratio: 1.7 (ref 1.2–2.2)
Albumin: 4.4 g/dL (ref 4.0–5.0)
Alkaline Phosphatase: 76 IU/L (ref 44–121)
BUN/Creatinine Ratio: 16 (ref 9–20)
BUN: 10 mg/dL (ref 6–20)
Bilirubin Total: 0.5 mg/dL (ref 0.0–1.2)
CO2: 22 mmol/L (ref 20–29)
Calcium: 9.5 mg/dL (ref 8.7–10.2)
Chloride: 105 mmol/L (ref 96–106)
Creatinine, Ser: 0.63 mg/dL — ABNORMAL LOW (ref 0.76–1.27)
Globulin, Total: 2.6 g/dL (ref 1.5–4.5)
Glucose: 226 mg/dL — ABNORMAL HIGH (ref 70–99)
Potassium: 4.2 mmol/L (ref 3.5–5.2)
Sodium: 141 mmol/L (ref 134–144)
Total Protein: 7 g/dL (ref 6.0–8.5)
eGFR: 124 mL/min/{1.73_m2} (ref >59)

## 2021-05-27 LAB — LIPID PANEL W/O CHOL/HDL RATIO
Cholesterol, Total: 262 mg/dL — ABNORMAL HIGH (ref 100–199)
HDL: 42 mg/dL (ref >39)
LDL Chol Calc (NIH): 143 mg/dL — ABNORMAL HIGH (ref 0–99)
Triglycerides: 416 mg/dL — ABNORMAL HIGH (ref 0–149)
VLDL Cholesterol Cal: 77 mg/dL — ABNORMAL HIGH (ref 5–40)

## 2021-05-27 LAB — HEPATITIS C ANTIBODY: Hep C Virus Ab: <0.1 s/co ratio (ref 0.0–0.9)

## 2021-05-27 LAB — MICROALBUMIN / CREATININE URINE RATIO
Creatinine, Urine: 134.4 mg/dL
Microalb/Creat Ratio: 50 mg/g creat — ABNORMAL HIGH (ref 0–29)
Microalbumin, Urine: 67.3 ug/mL

## 2021-05-27 LAB — CBC WITH DIFFERENTIAL/PLATELET

## 2021-05-27 LAB — HIV ANTIBODY (ROUTINE TESTING W REFLEX): HIV Screen 4th Generation wRfx: NONREACTIVE

## 2021-06-16 ENCOUNTER — Telehealth: Payer: Self-pay

## 2021-06-16 NOTE — Telephone Encounter (Signed)
Call to report that Patient has been experiencing lower back pain that was previously discussed when he fell off a ladder about 3 months ago. Pain started after coming back from vacation where he had to do a lot of walking. Patient was back home 21st and pain started 22nd November. Pain is only on lower back. Makes it difficult to walk. Took tylenol and helped a bit. Last night 06/15/21 he heard a pop in his back and pain returned this morning. Tylenol no longer helps

## 2021-06-17 ENCOUNTER — Other Ambulatory Visit: Payer: Self-pay

## 2021-06-17 ENCOUNTER — Encounter: Payer: Self-pay | Admitting: Orthopaedic Surgery

## 2021-06-17 ENCOUNTER — Ambulatory Visit (INDEPENDENT_AMBULATORY_CARE_PROVIDER_SITE_OTHER): Payer: Self-pay

## 2021-06-17 ENCOUNTER — Ambulatory Visit (INDEPENDENT_AMBULATORY_CARE_PROVIDER_SITE_OTHER): Payer: Self-pay | Admitting: Orthopaedic Surgery

## 2021-06-17 VITALS — BP 136/84 | HR 81 | Ht 66.0 in | Wt 227.0 lb

## 2021-06-17 DIAGNOSIS — M545 Low back pain, unspecified: Secondary | ICD-10-CM

## 2021-06-17 MED ORDER — HYDROCODONE-ACETAMINOPHEN 5-325 MG PO TABS
1.0000 | ORAL_TABLET | Freq: Four times a day (QID) | ORAL | 0 refills | Status: DC | PRN
Start: 2021-06-17 — End: 2021-12-22

## 2021-06-17 NOTE — Progress Notes (Signed)
Office Visit Note   Patient: Austin Mejia           Date of Birth: Oct 03, 1980           MRN: [id number] Visit Date: 06/17/2021              Requested by: Willaim Rayas, NP [address] [place],  [place] [address] PCP: Willaim Rayas, NP   Assessment & Plan: Visit Diagnoses:  1. Acute left-sided low back pain, unspecified whether sciatica present     Plan: Prescription for Norco 20 tablets prescribed 1 p.o. every 6 as needed pain.  We discussed weight loss improved diabetic control with improvement in his diet.  He needs to lose weight and needs to get his A1c significantly improved from greater than 11.  He can follow-up if he develops progressive weakness.  I called on her cell phone discussed.  His sister-in-law who was present for the visit the x-ray changes.  He will work hard on diet improvement diabetes improvement, weight loss and follow-up of his persistent symptoms or claudication or radiculopathy symptoms.  Follow-Up Instructions: Return if symptoms worsen or fail to improve.   Orders:  Orders Placed This Encounter  Procedures   XR Lumbar Spine 2-3 Views   No orders of the defined types were placed in this encounter.     Procedures: No procedures performed   Clinical Data: No additional findings.   Subjective: Chief Complaint  Patient presents with   Lower Back - Pain    HPI 40 year old male with diabetes on oral medication and elevated A1c greater than 11 originally stated seen injured his back about a month ago but states in the last few days that increased pain.  He has used Aleve without relief.  He denies any specific fall however he was somewhat hesitant to answer through the interpreter and reviewed with Dr. Delrae Alfred note showed that he fell backwards off a small ladder landing on his buttocks 3 months ago and I did not notice this until after he had completed his visit and left the office.  Review of Systems type 2 diabetes  with elevated A1c.   Objective: Vital Signs: BP 136/84   Pulse 81   Ht 5\' 6"  (1.676 m)   Wt 227 lb (103 kg)   BMI 36.64 kg/m   Physical Exam Constitutional:      Appearance: He is well-developed.  HENT:     Head: Normocephalic and atraumatic.     Right Ear: External ear normal.     Left Ear: External ear normal.  Eyes:     Pupils: Pupils are equal, round, and reactive to light.  Neck:     Thyroid: No thyromegaly.     Trachea: No tracheal deviation.  Cardiovascular:     Rate and Rhythm: Normal rate.  Pulmonary:     Effort: Pulmonary effort is normal.     Breath sounds: No wheezing.  Abdominal:     General: Bowel sounds are normal.     Palpations: Abdomen is soft.  Musculoskeletal:     Cervical back: Neck supple.  Skin:    General: Skin is warm and dry.     Capillary Refill: Capillary refill takes less than 2 seconds.  Neurological:     Mental Status: He is alert and oriented to person, place, and time.  Psychiatric:        Behavior: Behavior normal.        Thought Content: Thought content normal.  Judgment: Judgment normal.    Ortho Exam patient is able to heel and toe walk.  Negative straight leg raising 90 degrees no sciatic notch tenderness.  Some tenderness at the upper lumbar region left side paralumbar more than right.  He has some discomfort forward flexion.  Quad strong negative logroll the hips abductor strong normal hamstrings anterior tib gastrocsoleus normal.  Specialty Comments:  No specialty comments available.  Imaging: No results found.   PMFS History: Patient Active Problem List   Diagnosis Date Noted   Type 2 diabetes mellitus without complication, without long-term current use of insulin (HCC) 01/25/2014   OBESITY, NOS 09/16/2006   HYPERTENSION, BENIGN SYSTEMIC 09/16/2006   RHINITIS, ALLERGIC 09/16/2006   APNEA, SLEEP 09/16/2006   Past Medical History:  Diagnosis Date   Diabetes mellitus without complication (HCC)     Hyperlipidemia    Hypertension    Obesity y-10   Sleep apnea     Family History  Problem Relation Age of Onset   Diabetes Mother    Hypertension Mother    Hypertension Father    Hyperlipidemia Father    Diabetes Father    Hypertension Brother     No past surgical history on file. Social History   Occupational History   Not on file  Tobacco Use   Smoking status: Never   Smokeless tobacco: Never  Vaping Use   Vaping Use: Never used  Substance and Sexual Activity   Alcohol use: No   Drug use: Never   Sexual activity: Yes    Birth control/protection: Condom

## 2021-06-18 ENCOUNTER — Ambulatory Visit: Payer: Self-pay | Admitting: Internal Medicine

## 2021-06-18 ENCOUNTER — Encounter: Payer: Self-pay | Admitting: Internal Medicine

## 2021-06-18 VITALS — BP 118/80 | HR 68 | Resp 16 | Ht 66.0 in | Wt 227.0 lb

## 2021-06-18 DIAGNOSIS — S32028S Other fracture of second lumbar vertebra, sequela: Secondary | ICD-10-CM

## 2021-06-18 MED ORDER — NAPROXEN 500 MG PO TABS: 500.0000 mg | ORAL_TABLET | Freq: Two times a day (BID) | ORAL | 2 refills | Status: DC

## 2021-06-18 NOTE — Progress Notes (Unsigned)
    Subjective:    Patient ID: Austin Mejia, male   DOB: 12-30-1980, 40 y.o.   MRN: [id number]   HPI   Left low back pain:  was evaluated for the back pain yesterday by Dr. Ophelia Charter, ortho.  Did not realize he should cancel this appt for same reason.  Has not been to work for 3 weeks, but the hydrocodone makes him sleepy and does not feel he can go back to work while taking the med, though it does help with the pain.  He did not share he needed to get back to work with Dr. Ophelia Charter.    Current Meds  Medication Sig   atorvastatin (LIPITOR) 20 MG tablet Take 1 tablet (20 mg total) by mouth daily.   glipiZIDE (GLUCOTROL) 10 MG tablet Take 1 tablet (10 mg total) by mouth 2 (two) times daily before a meal. (Patient taking differently: Take 10 mg by mouth 2 (two) times daily before a meal. 1 tab daily)   HYDROcodone-acetaminophen (NORCO/VICODIN) 5-325 MG tablet Take 1 tablet by mouth every 6 (six) hours as needed for moderate pain.   lisinopril (ZESTRIL) 40 MG tablet Take 1 tablet (40 mg total) by mouth daily.   metFORMIN (GLUCOPHAGE) 1000 MG tablet Take 1 tablet (1,000 mg total) by mouth 2 (two) times daily with a meal.   Naproxen Sodium (ALEVE PO) Take by mouth.   Omega-3 Fatty Acids (FISH OIL) 1000 MG CAPS Take by mouth.   No Known Allergies   Review of Systems    Objective:   BP 118/80 (BP Location: Right Arm, Patient Position: Sitting, Cuff Size: Normal)   Pulse 68   Resp 16   Ht 5\' 6"  (1.676 m)   Wt 227 lb (103 kg)   BMI 36.64 kg/m   Physical Exam   Assessment & Plan   discussed labs

## 2021-06-19 NOTE — Telephone Encounter (Signed)
Patient was seen by Dr Mulberry °

## 2021-09-12 ENCOUNTER — Ambulatory Visit: Payer: Self-pay | Admitting: Internal Medicine

## 2021-11-05 ENCOUNTER — Ambulatory Visit: Payer: Self-pay | Admitting: Internal Medicine

## 2021-12-22 ENCOUNTER — Ambulatory Visit: Payer: Self-pay | Admitting: Internal Medicine

## 2021-12-22 VITALS — BP 132/88 | HR 80 | Resp 16 | Ht 66.0 in | Wt 228.0 lb

## 2021-12-22 DIAGNOSIS — E119 Type 2 diabetes mellitus without complications: Secondary | ICD-10-CM

## 2021-12-22 DIAGNOSIS — I10 Essential (primary) hypertension: Secondary | ICD-10-CM

## 2021-12-22 DIAGNOSIS — E1169 Type 2 diabetes mellitus with other specified complication: Secondary | ICD-10-CM

## 2021-12-22 DIAGNOSIS — B351 Tinea unguium: Secondary | ICD-10-CM

## 2021-12-22 MED ORDER — METFORMIN HCL 1000 MG PO TABS: 1000.0000 mg | ORAL_TABLET | Freq: Two times a day (BID) | ORAL | 11 refills | Status: DC

## 2021-12-22 MED ORDER — LISINOPRIL 40 MG PO TABS: 40.0000 mg | ORAL_TABLET | Freq: Every day | ORAL | 11 refills | Status: DC

## 2021-12-22 MED ORDER — GLIPIZIDE 10 MG PO TABS: 10.0000 mg | ORAL_TABLET | Freq: Two times a day (BID) | ORAL | 11 refills | Status: DC

## 2021-12-22 MED ORDER — ATORVASTATIN CALCIUM 20 MG PO TABS: 20.0000 mg | ORAL_TABLET | Freq: Every day | ORAL | 11 refills | Status: DC

## 2021-12-22 MED ORDER — NAPROXEN 500 MG PO TABS: 500.0000 mg | ORAL_TABLET | Freq: Two times a day (BID) | ORAL | 4 refills | Status: DC

## 2021-12-22 NOTE — Progress Notes (Unsigned)
    Subjective:    Patient ID: Austin Mejia, male   DOB: 20-Aug-1980, 41 y.o.   MRN: [id number]   HPI  Here after canceled appts/several months hiatus.  Karen Kays interprets.   DM:  Has been poorly controlled.  Bottle for Metformin for 30 days dated January.  Glipizide with pills still in it dated April 30th .  A1C was above 11 in November  2.  Hyperlipidemia:  not controlled.  Bottle for Atorvastatin 30 day supply also dated January.  Cholesterol quite high in November.    3.  Hypertension:  Lisinopril last filled 11/16/2021.  Should be done for 6 days already.    4.  Back pain:  needs Rx for Naproxen.  Difficulty getting him to focus on what gets in his way of taking medication.  Leaves the house at 7:10 a.m.  Up at 6:30 a.m. May just eat an apple or juice for breakfast. Generally, does not eat until he gets to work. Can have a salad or sandwich for lunch.     Current Meds  Medication Sig   atorvastatin (LIPITOR) 20 MG tablet Take 1 tablet (20 mg total) by mouth daily.   b complex vitamins capsule Take 1 capsule by mouth daily.   cyanocobalamin 1000 MCG tablet Take 1,000 mcg by mouth daily.   glipiZIDE (GLUCOTROL) 10 MG tablet Take 1 tablet (10 mg total) by mouth 2 (two) times daily before a meal.   lisinopril (ZESTRIL) 40 MG tablet Take 1 tablet (40 mg total) by mouth daily.   metFORMIN (GLUCOPHAGE) 1000 MG tablet Take 1 tablet (1,000 mg total) by mouth 2 (two) times daily with a meal.   Omega-3 Fatty Acids (FISH OIL) 1000 MG CAPS Take by mouth.   No Known Allergies   Review of Systems    Objective:   BP 132/88 (BP Location: Left Arm, Patient Position: Sitting, Cuff Size: Normal)   Pulse 80   Resp 16   Ht 5\' 6"  (1.676 m)   Wt 228 lb (103.4 kg)   BMI 36.80 kg/m   Physical Exam  .diab Assessment & Plan    Noncompliance:  Pill boxes for AM and separately for PM set up.  He does not work on Sundays, so will fill pill boxes then.  Pillboxes will  go next to where he eats breakfast and dinner daily.  He will take his med list to the pharmacy to make sure he gets all his meds.  He will call in his prescriptions on the first of every month and pick up by the 3rd of each month.  2.    DM:  Needs orange card for referral to eye specialist  Hole on treating toenails for now.

## 2021-12-22 NOTE — Patient Instructions (Addendum)
Tome un vaso de agua antes de cada comida Tome un minimo de 6 a 8 vasos de agua diarios Coma tres veces al dia Coma una proteina y Neomia Dear grasa saludable con comida.  (huevos, pescado, pollo, pavo, y limite carnes rojas Coma 5 porciones diarias de legumbres.  Mezcle los colores Coma 2 porciones diarias de frutas con cascara cuando sea comestible Use platos pequeos Suelte su tenedor o cuchara despues de cada mordida hata que se mastique y se trague Come en la mesa con amigos o familiares por lo menos una vez al dia Apague la televisin y aparatos electrnicos durante la comida  Su objetivo debe ser perder una libra por semana  Estudios recientes indican que las personas quienes consumen todos de sus calorias durante 12 horas se bajan de pesocon Mas eficiencia.  Por ejemplo, si Usted come su primera comida a las 7:00 a.m., su comida final del dia se debe completar antes de las 7:00 p.m.   Fill pill boxes every Sunday Call prescriptions in the first day of every month Pick up prescriptions by the 3rd of every month. Take you meds with breakfast or dinner every day.   Get rid of old bottles and don't transfer new bottles into old.

## 2022-03-24 ENCOUNTER — Other Ambulatory Visit: Payer: Self-pay

## 2022-04-03 ENCOUNTER — Other Ambulatory Visit: Payer: Self-pay

## 2022-04-03 DIAGNOSIS — E119 Type 2 diabetes mellitus without complications: Secondary | ICD-10-CM

## 2022-04-03 DIAGNOSIS — Z79899 Other long term (current) drug therapy: Secondary | ICD-10-CM

## 2022-04-03 DIAGNOSIS — E1169 Type 2 diabetes mellitus with other specified complication: Secondary | ICD-10-CM

## 2022-04-05 LAB — COMPREHENSIVE METABOLIC PANEL
ALT: 12 IU/L (ref 0–44)
AST: 11 IU/L (ref 0–40)
Albumin/Globulin Ratio: 1.8 (ref 1.2–2.2)
Albumin: 4.2 g/dL (ref 4.1–5.1)
Alkaline Phosphatase: 71 IU/L (ref 44–121)
BUN/Creatinine Ratio: 21 — ABNORMAL HIGH (ref 9–20)
BUN: 13 mg/dL (ref 6–24)
Bilirubin Total: 0.4 mg/dL (ref 0.0–1.2)
CO2: 21 mmol/L (ref 20–29)
Calcium: 9 mg/dL (ref 8.7–10.2)
Chloride: 101 mmol/L (ref 96–106)
Creatinine, Ser: 0.63 mg/dL — ABNORMAL LOW (ref 0.76–1.27)
Globulin, Total: 2.4 g/dL (ref 1.5–4.5)
Glucose: 229 mg/dL — ABNORMAL HIGH (ref 70–99)
Potassium: 4.4 mmol/L (ref 3.5–5.2)
Sodium: 139 mmol/L (ref 134–144)
Total Protein: 6.6 g/dL (ref 6.0–8.5)
eGFR: 123 mL/min/{1.73_m2} (ref >59)

## 2022-04-05 LAB — LIPID PANEL W/O CHOL/HDL RATIO
Cholesterol, Total: 154 mg/dL (ref 100–199)
HDL: 35 mg/dL — ABNORMAL LOW (ref >39)
LDL Chol Calc (NIH): 70 mg/dL (ref 0–99)
Triglycerides: 307 mg/dL — ABNORMAL HIGH (ref 0–149)
VLDL Cholesterol Cal: 49 mg/dL — ABNORMAL HIGH (ref 5–40)

## 2022-04-05 LAB — HEMOGLOBIN A1C
Est. average glucose Bld gHb Est-mCnc: 278 mg/dL
Hgb A1c MFr Bld: 11.3 % — ABNORMAL HIGH (ref 4.8–5.6)

## 2022-04-05 LAB — MICROALBUMIN / CREATININE URINE RATIO
Creatinine, Urine: 78.2 mg/dL
Microalb/Creat Ratio: 21 mg/g creat (ref 0–29)
Microalbumin, Urine: 16.8 ug/mL

## 2022-05-01 ENCOUNTER — Other Ambulatory Visit: Payer: Self-pay | Admitting: Internal Medicine

## 2022-11-20 ENCOUNTER — Other Ambulatory Visit: Payer: Self-pay | Admitting: Internal Medicine

## 2022-12-16 ENCOUNTER — Other Ambulatory Visit: Payer: Self-pay | Admitting: Internal Medicine

## 2023-01-13 ENCOUNTER — Other Ambulatory Visit: Payer: Self-pay

## 2023-01-13 MED ORDER — METFORMIN HCL 1000 MG PO TABS: 1000.0000 mg | ORAL_TABLET | Freq: Two times a day (BID) | ORAL | 3 refills | Status: DC

## 2023-01-13 MED ORDER — LISINOPRIL 40 MG PO TABS: 40.0000 mg | ORAL_TABLET | Freq: Every day | ORAL | 3 refills | Status: DC

## 2023-01-13 MED ORDER — NAPROXEN 500 MG PO TABS: 500.0000 mg | ORAL_TABLET | Freq: Two times a day (BID) | ORAL | 0 refills | Status: DC

## 2023-01-13 MED ORDER — ATORVASTATIN CALCIUM 20 MG PO TABS: 20.0000 mg | ORAL_TABLET | Freq: Every day | ORAL | 3 refills | Status: DC

## 2023-01-13 MED ORDER — GLIPIZIDE 10 MG PO TABS: 10.0000 mg | ORAL_TABLET | Freq: Two times a day (BID) | ORAL | 3 refills | Status: DC

## 2023-04-13 ENCOUNTER — Other Ambulatory Visit: Payer: Self-pay

## 2023-04-13 DIAGNOSIS — E119 Type 2 diabetes mellitus without complications: Secondary | ICD-10-CM

## 2023-04-13 DIAGNOSIS — E1169 Type 2 diabetes mellitus with other specified complication: Secondary | ICD-10-CM

## 2023-04-13 DIAGNOSIS — E785 Hyperlipidemia, unspecified: Secondary | ICD-10-CM

## 2023-04-13 DIAGNOSIS — Z79899 Other long term (current) drug therapy: Secondary | ICD-10-CM

## 2023-04-13 MED ORDER — METFORMIN HCL 1000 MG PO TABS: 1000.0000 mg | ORAL_TABLET | Freq: Two times a day (BID) | ORAL | 0 refills | Status: DC

## 2023-04-13 MED ORDER — ATORVASTATIN CALCIUM 20 MG PO TABS: 20.0000 mg | ORAL_TABLET | Freq: Every day | ORAL | 0 refills | Status: DC

## 2023-04-13 MED ORDER — LISINOPRIL 40 MG PO TABS: 40.0000 mg | ORAL_TABLET | Freq: Every day | ORAL | 0 refills | Status: DC

## 2023-04-13 MED ORDER — GLIPIZIDE 10 MG PO TABS: 10.0000 mg | ORAL_TABLET | Freq: Two times a day (BID) | ORAL | 0 refills | Status: DC

## 2023-04-13 NOTE — Addendum Note (Signed)
Addended by: Duayne Cal on: 04/13/2023 08:47 AM   Modules accepted: Orders

## 2023-04-14 LAB — COMPREHENSIVE METABOLIC PANEL
ALT: 16 IU/L (ref 0–44)
AST: 13 IU/L (ref 0–40)
Albumin: 4.2 g/dL (ref 4.1–5.1)
Alkaline Phosphatase: 79 IU/L (ref 44–121)
BUN/Creatinine Ratio: 21 — ABNORMAL HIGH (ref 9–20)
BUN: 15 mg/dL (ref 6–24)
Bilirubin Total: 0.4 mg/dL (ref 0.0–1.2)
CO2: 19 mmol/L — ABNORMAL LOW (ref 20–29)
Calcium: 9.4 mg/dL (ref 8.7–10.2)
Chloride: 100 mmol/L (ref 96–106)
Creatinine, Ser: 0.72 mg/dL — ABNORMAL LOW (ref 0.76–1.27)
Globulin, Total: 2.5 g/dL (ref 1.5–4.5)
Glucose: 291 mg/dL — ABNORMAL HIGH (ref 70–99)
Potassium: 4.8 mmol/L (ref 3.5–5.2)
Sodium: 134 mmol/L (ref 134–144)
Total Protein: 6.7 g/dL (ref 6.0–8.5)
eGFR: 118 mL/min/{1.73_m2} (ref >59)

## 2023-04-14 LAB — CBC WITH DIFFERENTIAL/PLATELET
Basophils Absolute: 0.1 10*3/uL (ref 0.0–0.2)
Basos: 1 %
EOS (ABSOLUTE): 0.1 10*3/uL (ref 0.0–0.4)
Eos: 1 %
Hematocrit: 46 % (ref 37.5–51.0)
Hemoglobin: 14.2 g/dL (ref 13.0–17.7)
Immature Grans (Abs): 0 10*3/uL (ref 0.0–0.1)
Immature Granulocytes: 0 %
Lymphocytes Absolute: 3 10*3/uL (ref 0.7–3.1)
Lymphs: 33 %
MCH: 28.6 pg (ref 26.6–33.0)
MCHC: 30.9 g/dL — ABNORMAL LOW (ref 31.5–35.7)
MCV: 93 fL (ref 79–97)
Monocytes Absolute: 0.5 10*3/uL (ref 0.1–0.9)
Monocytes: 6 %
Neutrophils Absolute: 5.3 10*3/uL (ref 1.4–7.0)
Neutrophils: 59 %
Platelets: 304 10*3/uL (ref 150–450)
RBC: 4.96 x10E6/uL (ref 4.14–5.80)
RDW: 12.9 % (ref 11.6–15.4)
WBC: 9 10*3/uL (ref 3.4–10.8)

## 2023-04-14 LAB — LIPID PANEL W/O CHOL/HDL RATIO
Cholesterol, Total: 190 mg/dL (ref 100–199)
HDL: 36 mg/dL — ABNORMAL LOW (ref >39)
LDL Chol Calc (NIH): 87 mg/dL (ref 0–99)
Triglycerides: 406 mg/dL — ABNORMAL HIGH (ref 0–149)
VLDL Cholesterol Cal: 67 mg/dL — ABNORMAL HIGH (ref 5–40)

## 2023-04-14 LAB — HEMOGLOBIN A1C
Est. average glucose Bld gHb Est-mCnc: 306 mg/dL
Hgb A1c MFr Bld: 12.3 % — ABNORMAL HIGH (ref 4.8–5.6)

## 2023-04-20 ENCOUNTER — Ambulatory Visit: Payer: Self-pay | Admitting: Internal Medicine

## 2023-04-20 ENCOUNTER — Encounter: Payer: Self-pay | Admitting: Internal Medicine

## 2023-04-20 VITALS — BP 122/80 | HR 112 | Resp 16 | Ht 66.0 in | Wt 223.0 lb

## 2023-04-20 DIAGNOSIS — E66812 Obesity, class 2: Secondary | ICD-10-CM

## 2023-04-20 DIAGNOSIS — Z23 Encounter for immunization: Secondary | ICD-10-CM

## 2023-04-20 DIAGNOSIS — E1169 Type 2 diabetes mellitus with other specified complication: Secondary | ICD-10-CM

## 2023-04-20 DIAGNOSIS — Z5948 Other specified lack of adequate food: Secondary | ICD-10-CM

## 2023-04-20 DIAGNOSIS — E119 Type 2 diabetes mellitus without complications: Secondary | ICD-10-CM

## 2023-04-20 DIAGNOSIS — I10 Essential (primary) hypertension: Secondary | ICD-10-CM

## 2023-04-20 MED ORDER — ATORVASTATIN CALCIUM 20 MG PO TABS: 20.0000 mg | ORAL_TABLET | Freq: Every day | ORAL | 11 refills | Status: AC

## 2023-04-20 MED ORDER — LISINOPRIL 40 MG PO TABS: 40.0000 mg | ORAL_TABLET | Freq: Every day | ORAL | 11 refills | Status: AC

## 2023-04-20 MED ORDER — GLIPIZIDE 10 MG PO TABS: 10.0000 mg | ORAL_TABLET | Freq: Two times a day (BID) | ORAL | 11 refills | Status: AC

## 2023-04-20 MED ORDER — METFORMIN HCL 1000 MG PO TABS: 1000.0000 mg | ORAL_TABLET | Freq: Two times a day (BID) | ORAL | 11 refills | Status: AC

## 2023-04-20 MED ORDER — OZEMPIC (0.25 OR 0.5 MG/DOSE) 2 MG/3ML ~~LOC~~ SOPN: 0.2500 mg | PEN_INJECTOR | SUBCUTANEOUS | 6 refills | Status: AC

## 2023-04-20 NOTE — Progress Notes (Unsigned)
    Subjective:    Patient ID: Austin Mejia, male   DOB: June 17, 1981, 42 y.o.   MRN: [id number]   HPI  Here after more than one years  Tereasa Coop interprets  Discussed not clear what he wants out of his care.  He does not feel he is in denial of his illnesses.     DM:  A1C up to 12.3%.  Clear he is without medication for prolonged periods of time.  2. Hyperlipidemia:  Same issue. Lipid Panel     Component Value Date/Time   CHOL 190 04/13/2023 0847   TRIG 406 (H) 04/13/2023 0847   HDL 36 (L) 04/13/2023 0847   CHOLHDL 4.5 03/02/2017 1030   CHOLHDL 3.6 01/26/2014 0906   VLDL 34 01/26/2014 0906   LDLCALC 87 04/13/2023 0847   LABVLDL 67 (H) 04/13/2023 0847   3.  Hypertension:  based on history, is taking Lisinopril.    Current Meds  Medication Sig   atorvastatin (LIPITOR) 20 MG tablet Take 1 tablet (20 mg total) by mouth daily.   b complex vitamins capsule Take 1 capsule by mouth daily.   cyanocobalamin 1000 MCG tablet Take 1,000 mcg by mouth daily.   glipiZIDE (GLUCOTROL) 10 MG tablet Take 1 tablet (10 mg total) by mouth 2 (two) times daily before a meal.   lisinopril (ZESTRIL) 40 MG tablet Take 1 tablet (40 mg total) by mouth daily.   metFORMIN (GLUCOPHAGE) 1000 MG tablet Take 1 tablet (1,000 mg total) by mouth 2 (two) times daily with a meal.   Omega-3 Fatty Acids (FISH OIL) 1000 MG CAPS Take by mouth.   No Known Allergies   Review of Systems    Objective:   BP 122/80 (BP Location: Right Arm, Patient Position: Sitting, Cuff Size: Normal)   Pulse (!) 112   Resp 16   Ht 5\' 6"  (1.676 m)   Wt 223 lb (101.2 kg)   BMI 35.99 kg/m   Physical Exam   Assessment & Plan  Eye doctor after orange card. Switch atorvastatin to [facility] when has orange card or ozempic

## 2023-05-17 ENCOUNTER — Other Ambulatory Visit: Payer: Self-pay

## 2023-05-17 MED ORDER — NAPROXEN 500 MG PO TABS: 500.0000 mg | ORAL_TABLET | Freq: Two times a day (BID) | ORAL | 0 refills | Status: AC

## 2023-05-19 ENCOUNTER — Other Ambulatory Visit: Payer: Self-pay

## 2023-08-20 ENCOUNTER — Other Ambulatory Visit: Payer: Self-pay

## 2023-08-25 ENCOUNTER — Ambulatory Visit: Payer: Self-pay | Admitting: Internal Medicine

## 2023-11-15 ENCOUNTER — Other Ambulatory Visit: Payer: Self-pay

## 2023-11-18 ENCOUNTER — Ambulatory Visit: Payer: Self-pay | Admitting: Internal Medicine

## 2023-12-21 ENCOUNTER — Encounter (HOSPITAL_COMMUNITY): Payer: Self-pay | Admitting: Emergency Medicine

## 2023-12-21 ENCOUNTER — Ambulatory Visit (HOSPITAL_COMMUNITY)
Admission: EM | Admit: 2023-12-21 | Discharge: 2023-12-21 | Disposition: A | Discharge status: Home / Self Care | Payer: Self-pay | Attending: Nurse Practitioner | Admitting: Nurse Practitioner

## 2023-12-21 DIAGNOSIS — S0101XA Laceration without foreign body of scalp, initial encounter: Secondary | ICD-10-CM

## 2023-12-21 DIAGNOSIS — Z23 Encounter for immunization: Secondary | ICD-10-CM

## 2023-12-21 MED ORDER — TETANUS-DIPHTH-ACELL PERTUSSIS 5-2.5-18.5 LF-MCG/0.5 IM SUSY
PREFILLED_SYRINGE | INTRAMUSCULAR | Status: AC
  Filled 2023-12-21: qty 0.5

## 2023-12-21 MED ORDER — TETANUS-DIPHTH-ACELL PERTUSSIS 5-2.5-18.5 LF-MCG/0.5 IM SUSY
0.5000 mL | PREFILLED_SYRINGE | Freq: Once | INTRAMUSCULAR | Status: AC
  Administered 2023-12-21: 0.5 mL via INTRAMUSCULAR

## 2023-12-21 MED ORDER — LIDOCAINE-EPINEPHRINE-TETRACAINE (LET) TOPICAL GEL
TOPICAL | Status: AC
  Filled 2023-12-21: qty 3

## 2023-12-21 MED ORDER — LIDOCAINE-EPINEPHRINE-TETRACAINE (LET) TOPICAL GEL
3.0000 mL | Freq: Once | TOPICAL | Status: AC
  Administered 2023-12-21: 3 mL via TOPICAL

## 2023-12-21 NOTE — Discharge Instructions (Addendum)
 We put 3 staples in the cut on your scalp today.  Please keep the first bandage on for 12 hours, then remove and clean with soap and water.  Keep the area covered and antibacterial ointment applied to it until it heals.  When it heals, you can leave it open to air unless you are at work, then keep it covered at work as well.  Recommend follow-up in 7 to 10 days for staple removal.

## 2023-12-21 NOTE — ED Provider Notes (Signed)
 [facility]    CSN: [id number] Arrival date & time: 12/21/23  1125      History   Chief Complaint Chief Complaint  Patient presents with   Head Injury    HPI Austin Mejia is a 43 y.o. male.   Patient presents today with scalp laceration.  Reports he was cutting trees down when a branch fell and hit him in the head.  Reports it began bleeding immediately.  Denies falling, loss of consciousness, use of blood thinners, or vomiting since injury.  No change in behavior or change in mental status.  Patient went home and wife cleaned the area and put antibiotic ointment on it and then they came to urgent care.  Last Tdap unknown.    Past Medical History:  Diagnosis Date   Diabetes mellitus without complication (HCC)    Hyperlipidemia    Hypertension    Obesity y-10   Sleep apnea     Patient Active Problem List   Diagnosis Date Noted   Type 2 diabetes mellitus without complication, without long-term current use of insulin (HCC) 01/25/2014   OBESITY, NOS 09/16/2006   HYPERTENSION, BENIGN SYSTEMIC 09/16/2006   Allergic rhinitis 09/16/2006   Sleep apnea 09/16/2006    History reviewed. No pertinent surgical history.     Home Medications    Prior to Admission medications   Medication Sig Start Date End Date Taking? Authorizing Provider  atorvastatin  (LIPITOR) 20 MG tablet Take 1 tablet (20 mg total) by mouth daily. 04/20/23   Ronalee Cocking, MD  b complex vitamins capsule Take 1 capsule by mouth daily.    [provider]  blood glucose meter kit and supplies KIT Dispense based on patient and insurance preference. Use up to four times daily as directed. (FOR ICD-9 250.00, 250.01). Patient not taking: Reported on 06/18/2021 03/02/17   Jamas Maywood, PA-C  cyanocobalamin 1000 MCG tablet Take 1,000 mcg by mouth daily.    [provider]  glipiZIDE  (GLUCOTROL ) 10 MG tablet Take 1 tablet (10 mg total) by mouth 2 (two) times daily  before a meal. 04/20/23   Ronalee Cocking, MD  lisinopril  (ZESTRIL ) 40 MG tablet Take 1 tablet (40 mg total) by mouth daily. 04/20/23   Ronalee Cocking, MD  metFORMIN  (GLUCOPHAGE ) 1000 MG tablet Take 1 tablet (1,000 mg total) by mouth 2 (two) times daily with a meal. 04/20/23   Ronalee Cocking, MD  naproxen  (NAPROSYN ) 500 MG tablet Take 1 tablet (500 mg total) by mouth 2 (two) times daily with a meal. 05/17/23   Ronalee Cocking, MD  Omega-3 Fatty Acids (FISH OIL) 1000 MG CAPS Take by mouth.    [provider]  Semaglutide ,0.25 or 0.5MG /DOS, (OZEMPIC , 0.25 OR 0.5 MG/DOSE,) 2 MG/3ML SOPN Inject 0.25 mg into the skin once a week. 04/20/23   Ronalee Cocking, MD    Family History Family History  Problem Relation Age of Onset   Diabetes Mother    Hypertension Mother    Hypertension Father    Hyperlipidemia Father    Diabetes Father    Hypertension Brother     Social History Social History   Tobacco Use   Smoking status: Never   Smokeless tobacco: Never  Vaping Use   Vaping status: Never Used  Substance Use Topics   Alcohol use: No   Drug use: Never     Allergies   Patient has no known allergies.   Review of Systems Review of Systems Per HPI  Physical Exam Triage Vital  Signs ED Triage Vitals  Encounter Vitals Group     BP 12/21/23 1257 131/71     Systolic BP Percentile --      Diastolic BP Percentile --      Pulse Rate 12/21/23 1257 79     Resp 12/21/23 1257 17     Temp 12/21/23 1257 97.8 F (36.6 C)     Temp Source 12/21/23 1257 Oral     SpO2 12/21/23 1257 96 %     Weight --      Height --      Head Circumference --      Peak Flow --      Pain Score 12/21/23 1256 2     Pain Loc --      Pain Education --      Exclude from Growth Chart --    No data found.  Updated Vital Signs BP 131/71 (BP Location: Right Arm)   Pulse 79   Temp 97.8 F (36.6 C) (Oral)   Resp 17   SpO2 96%   Visual Acuity Right Eye Distance:   Left Eye  Distance:   Bilateral Distance:    Right Eye Near:   Left Eye Near:    Bilateral Near:     Physical Exam Vitals and nursing note reviewed.  Constitutional:      General: He is not in acute distress.    Appearance: Normal appearance. He is not toxic-appearing.  HENT:     Head: Normocephalic and atraumatic. No Battle's sign.     Mouth/Throat:     Mouth: Mucous membranes are moist.  Pulmonary:     Effort: Pulmonary effort is normal. No respiratory distress.  Skin:    General: Skin is warm and dry.     Capillary Refill: Capillary refill takes less than 2 seconds.     Coloration: Skin is not jaundiced or pale.     Findings: Laceration present. No rash.          Comments: 3 cm laceration to crown of scalp in approximately area marked; no obvious bony defects  Neurological:     General: No focal deficit present.     Mental Status: He is alert and oriented to person, place, and time.  Psychiatric:        Behavior: Behavior is cooperative.      UC Treatments / Results  Labs (all labs ordered are listed, but only abnormal results are displayed) Labs Reviewed - No data to display  EKG   Radiology No results found.  Procedures Laceration Repair  Date/Time: 12/21/2023 2:31 PM  Performed by: Wilhemena Harbour, NP Authorized by: Wilhemena Harbour, NP   Consent:    Consent obtained:  Verbal   Consent given by:  Patient   Risks, benefits, and alternatives were discussed: yes     Risks discussed:  Infection, pain, retained foreign body and poor wound healing   Alternatives discussed:  Delayed treatment and observation Universal protocol:    Procedure explained and questions answered to patient or proxy's satisfaction: yes     Patient identity confirmed:  Verbally with patient Anesthesia:    Anesthesia method:  Topical application   Topical anesthetic:  LET Laceration details:    Location:  Scalp   Scalp location:  Crown   Length (cm):  3 Pre-procedure details:     Preparation:  Patient was prepped and draped in usual sterile fashion Exploration:    Limited defect created (wound extended): no  Hemostasis achieved with:  LET and direct pressure   Wound exploration: wound explored through full range of motion and entire depth of wound visualized     Contaminated: no   Treatment:    Area cleansed with:  Chlorhexidine and soap and water   Amount of cleaning:  Standard   Visualized foreign bodies/material removed: no   Skin repair:    Repair method:  Staples   Number of staples:  3 Approximation:    Approximation:  Close Repair type:    Repair type:  Simple Post-procedure details:    Dressing:  Antibiotic ointment and non-adherent dressing (coban)   Procedure completion:  Tolerated well, no immediate complications  (including critical care time)  Medications Ordered in UC Medications  Tdap (BOOSTRIX) injection 0.5 mL (0.5 mLs Intramuscular Given 12/21/23 1317)  lidocaine-EPINEPHrine-tetracaine (LET) topical gel (3 mLs Topical Given 12/21/23 1317)    Initial Impression / Assessment and Plan / UC Course  I have reviewed the triage vital signs and the nursing notes.  Pertinent labs & imaging results that were available during my care of the patient were reviewed by me and considered in my medical decision making (see chart for details).   Patient is well-appearing, normotensive, afebrile, not tachycardic, not tachypneic, oxygenating well on room air.   1. Laceration of scalp, initial encounter Laceration repair as above Wound care discussed with patient and wife Return and ER precautions discussed Follow-up in 7 to 10 days for staple removal  The patient was given the opportunity to ask questions.  All questions answered to their satisfaction.  The patient is in agreement to this plan.   Final Clinical Impressions(s) / UC Diagnoses   Final diagnoses:  Laceration of scalp, initial encounter     Discharge Instructions      We put 3  staples in the cut on your scalp today.  Please keep the first bandage on for 12 hours, then remove and clean with soap and water.  Keep the area covered and antibacterial ointment applied to it until it heals.  When it heals, you can leave it open to air unless you are at work, then keep it covered at work as well.  Recommend follow-up in 7 to 10 days for staple removal.   ED Prescriptions   None    PDMP not reviewed this encounter.   Wilhemena Harbour, NP 12/21/23 1432

## 2023-12-21 NOTE — ED Triage Notes (Signed)
 Pt was cutting trees when branch fell and hit him on the head. Denies LOC or taking blood thinners.

## 2024-01-14 ENCOUNTER — Other Ambulatory Visit: Payer: Self-pay

## 2024-01-18 ENCOUNTER — Ambulatory Visit: Payer: Self-pay | Admitting: Internal Medicine

## 2024-04-08 ENCOUNTER — Other Ambulatory Visit: Payer: Self-pay | Admitting: Internal Medicine
